# Patient Record
Sex: Male | Born: 1990 | Race: White | Hispanic: No | Smoking: Never smoker
Health system: Southern US, Community
[De-identification: ages and names within clinical notes are randomized; demographics above are authoritative.]

## PROBLEM LIST (undated history)

## (undated) DIAGNOSIS — J45909 Unspecified asthma, uncomplicated: Secondary | ICD-10-CM

## (undated) DIAGNOSIS — F419 Anxiety disorder, unspecified: Secondary | ICD-10-CM

## (undated) DIAGNOSIS — J189 Pneumonia, unspecified organism: Secondary | ICD-10-CM

## (undated) DIAGNOSIS — H5440 Blindness, one eye, unspecified eye: Secondary | ICD-10-CM

## (undated) HISTORY — PX: TONSILLECTOMY: SUR1361

---

## 2021-10-11 ENCOUNTER — Other Ambulatory Visit: Payer: Self-pay

## 2021-10-11 ENCOUNTER — Emergency Department (HOSPITAL_BASED_OUTPATIENT_CLINIC_OR_DEPARTMENT_OTHER)
Admission: EM | Admit: 2021-10-11 | Discharge: 2021-10-11 | Disposition: A | Discharge status: Home / Self Care | Payer: BC Managed Care – PPO | Attending: Emergency Medicine | Admitting: Emergency Medicine

## 2021-10-11 ENCOUNTER — Encounter (HOSPITAL_BASED_OUTPATIENT_CLINIC_OR_DEPARTMENT_OTHER): Payer: Self-pay | Admitting: *Deleted

## 2021-10-11 ENCOUNTER — Emergency Department (HOSPITAL_BASED_OUTPATIENT_CLINIC_OR_DEPARTMENT_OTHER): Payer: BC Managed Care – PPO | Admitting: Radiology

## 2021-10-11 DIAGNOSIS — R55 Syncope and collapse: Secondary | ICD-10-CM | POA: Diagnosis not present

## 2021-10-11 DIAGNOSIS — J45909 Unspecified asthma, uncomplicated: Secondary | ICD-10-CM | POA: Insufficient documentation

## 2021-10-11 HISTORY — DX: Unspecified asthma, uncomplicated: J45.909

## 2021-10-11 HISTORY — DX: Pneumonia, unspecified organism: J18.9

## 2021-10-11 HISTORY — DX: Anxiety disorder, unspecified: F41.9

## 2021-10-11 HISTORY — DX: Blindness, one eye, unspecified eye: H54.40

## 2021-10-11 NOTE — ED Triage Notes (Signed)
About an hour ago tightness in lower neck going to back, numbness in left body below the neck, blurred vision. States he is having trouble thinking and completing at sentences. No speech deficit in triage or difficulty answering questions.

## 2021-10-11 NOTE — ED Notes (Signed)
Pt d/c home per MD order. Discharge summary reviewed, pt verbalizes understanding. Ambulatory off unit with visitor. No s/s of acute distress noted at discharge.

## 2021-10-11 NOTE — Discharge Instructions (Signed)
Please return for worsening or persistent chest discomfort or if you get symptoms that do not improve with lying back flat.  Please let your doctor that prescribes your Adderall know that you are having some symptoms after taking it.  See if they want to change your dose or maybe change medications.

## 2021-10-11 NOTE — ED Provider Notes (Signed)
[facility] EMERGENCY DEPT Provider Note   CSN: [id number] Arrival date & time: 10/11/21  1330     History Chief Complaint  Patient presents with   Visual Field Change    Frederick Collins is a 30 y.o. male.  30 yo M with a chief complaints of feeling like there is pressure on both the front and back of his thorax this lasted for about a second and then he felt like he was short of breath and lightheaded and felt like his vision got blurry and he felt numb and tingly mostly on his left side.  He became quite anxious and had called 911.  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  He is progressively gotten better since then.  He denies any ongoing numbness or weakness.  Denies headaches or neck pain.  Denies cough congestion or fever.  He does endorse recently starting Adderall and has had some symptoms of feel like his heart is racing and some episodes of panic usually after having caffeine with it.  The history is provided by the patient and a friend.  Illness Severity:  Moderate Onset quality:  Gradual Duration:  2 minutes Timing:  Constant Progression:  Worsening Chronicity:  New Associated symptoms: chest pain (lasted for about a second, felt like someone pushed on the front and back)   Associated symptoms: no abdominal pain, no congestion, no diarrhea, no fever, no headaches, no myalgias, no rash, no shortness of breath and no vomiting       Past Medical History:  Diagnosis Date   Anxiety    Asthma    Blind right eye    Pneumonia     There are no problems to display for this patient.   Past Surgical History:  Procedure Laterality Date   TONSILLECTOMY         No family history on file.  Social History   Tobacco Use   Smoking status: Never   Smokeless tobacco: Never  Vaping Use   Vaping Use: Never used  Substance Use Topics   Alcohol use: Yes    Comment: rarely   Drug use: Not Currently    Home  Medications Prior to Admission medications   Not on File    Allergies    Vancomycin  Review of Systems   Review of Systems  Constitutional:  Negative for chills and fever.  HENT:  Negative for congestion and facial swelling.   Eyes:  Negative for discharge and visual disturbance.  Respiratory:  Negative for shortness of breath.   Cardiovascular:  Positive for chest pain (lasted for about a second, felt like someone pushed on the front and back). Negative for palpitations.  Gastrointestinal:  Negative for abdominal pain, diarrhea and vomiting.  Musculoskeletal:  Negative for arthralgias and myalgias.  Skin:  Negative for color change and rash.  Neurological:  Positive for syncope (near) and numbness (tingling mostly to left side of his body). Negative for tremors and headaches.  Psychiatric/Behavioral:  Negative for confusion and dysphoric mood.    Physical Exam Updated Vital Signs BP (!) 141/84   Pulse 90   Temp 98.4 F (36.9 C) (Oral)   Resp 18   Ht 5\' 8"  (1.727 m)   Wt 124.7 kg   SpO2 99%   BMI 41.81 kg/m   Physical Exam Vitals and nursing note reviewed.  Constitutional:      Appearance: He is well-developed.  HENT:     Head:  Normocephalic and atraumatic.  Eyes:     Pupils: Pupils are equal, round, and reactive to light.  Neck:     Vascular: No JVD.  Cardiovascular:     Rate and Rhythm: Normal rate and regular rhythm.     Heart sounds: No murmur heard.   No friction rub. No gallop.  Pulmonary:     Effort: No respiratory distress.     Breath sounds: No wheezing.  Abdominal:     General: There is no distension.     Tenderness: There is no abdominal tenderness. There is no guarding or rebound.  Musculoskeletal:        General: Normal range of motion.     Cervical back: Normal range of motion and neck supple.  Skin:    Coloration: Skin is not pale.     Findings: No rash.  Neurological:     Mental Status: He is alert and oriented to person, place, and time.      GCS: GCS eye subscore is 4. GCS verbal subscore is 5. GCS motor subscore is 6.     Cranial Nerves: Cranial nerves 2-12 are intact.     Sensory: Sensation is intact.     Motor: Motor function is intact.     Coordination: Coordination is intact.     Gait: Gait is intact.  Psychiatric:        Behavior: Behavior normal.    ED Results / Procedures / Treatments   Labs (all labs ordered are listed, but only abnormal results are displayed) Labs Reviewed - No data to display  EKG EKG Interpretation  Date/Time:  Friday October 11 2021 13:39:17 EST Ventricular Rate:  98 PR Interval:  154 QRS Duration: 94 QT Interval:  360 QTC Calculation: 459 R Axis:   63 Text Interpretation: Normal sinus rhythm Normal ECG no wpw, prolonged qt or brugada Otherwise no significant change Confirmed by Deno Etienne [phone]) on 10/11/2021 2:03:40 PM  Radiology No results found.  Procedures Procedures   Medications Ordered in ED Medications - No data to display  ED Course  I have reviewed the triage vital signs and the nursing notes.  Pertinent labs & imaging results that were available during my care of the patient were reviewed by me and considered in my medical decision making (see chart for details).    MDM Rules/Calculators/A&P                           30 yo M with a chief complaints of sensation that he had pressure on his chest and his back simultaneously it lasted for about a second it was followed by a period of feeling like his vision was blurry he had some tingling diffusely but worse on the left than the right and felt a bit fatigued.  This improved rapidly after he laid down flat.  I suspect he had a vasovagal event.  He is now very close if not at his baseline.  He had an EKG that does not show Wolff-Parkinson-White or prolonged QT or Brugada.  His chest pain sounds very atypical and I feel no further work-up is required for it.  We will have him follow-up with his family doctor.  2:30  PM:  I have discussed the diagnosis/risks/treatment options with the patient and family and believe the pt to be eligible for discharge home to follow-up with PCP. We also discussed returning to the ED immediately if new or worsening sx occur.  We discussed the sx which are most concerning (e.g., sudden worsening pain, fever, inability to tolerate by mouth) that necessitate immediate return. Medications administered to the patient during their visit and any new prescriptions provided to the patient are listed below.  Medications given during this visit Medications - No data to display   The patient appears reasonably screen and/or stabilized for discharge and I doubt any other medical condition or other [facility] requiring further screening, evaluation, or treatment in the ED at this time prior to discharge.   Final Clinical Impression(s) / ED Diagnoses Final diagnoses:  Vasovagal episode    Rx / DC Orders ED Discharge Orders     None        Deno Etienne, DO 10/11/21 1430

## 2021-10-24 DIAGNOSIS — F410 Panic disorder [episodic paroxysmal anxiety] without agoraphobia: Secondary | ICD-10-CM | POA: Diagnosis not present

## 2021-10-24 DIAGNOSIS — F909 Attention-deficit hyperactivity disorder, unspecified type: Secondary | ICD-10-CM | POA: Diagnosis not present

## 2021-10-26 DIAGNOSIS — H66003 Acute suppurative otitis media without spontaneous rupture of ear drum, bilateral: Secondary | ICD-10-CM | POA: Diagnosis not present

## 2021-10-26 DIAGNOSIS — H6123 Impacted cerumen, bilateral: Secondary | ICD-10-CM | POA: Diagnosis not present

## 2021-11-08 DIAGNOSIS — M722 Plantar fascial fibromatosis: Secondary | ICD-10-CM | POA: Diagnosis not present

## 2021-11-08 DIAGNOSIS — F329 Major depressive disorder, single episode, unspecified: Secondary | ICD-10-CM | POA: Diagnosis not present

## 2021-11-08 DIAGNOSIS — R220 Localized swelling, mass and lump, head: Secondary | ICD-10-CM | POA: Diagnosis not present

## 2021-11-08 DIAGNOSIS — F411 Generalized anxiety disorder: Secondary | ICD-10-CM | POA: Diagnosis not present

## 2021-11-15 DIAGNOSIS — R220 Localized swelling, mass and lump, head: Secondary | ICD-10-CM | POA: Diagnosis not present

## 2021-12-09 ENCOUNTER — Other Ambulatory Visit: Payer: Self-pay

## 2021-12-09 ENCOUNTER — Ambulatory Visit: Payer: BC Managed Care – PPO | Admitting: Plastic Surgery

## 2021-12-09 VITALS — BP 145/89 | HR 89 | Ht 67.0 in | Wt 291.6 lb

## 2021-12-09 DIAGNOSIS — D179 Benign lipomatous neoplasm, unspecified: Secondary | ICD-10-CM | POA: Diagnosis not present

## 2021-12-10 ENCOUNTER — Encounter: Payer: Self-pay | Admitting: Plastic Surgery

## 2021-12-10 NOTE — Progress Notes (Signed)
° °  Referring Provider Collene Leyden, MD [address] [place],  [place] [address]   CC:  Left forehead lipoma  Frederick Collins is an 31 y.o. male.  HPI: Patient is a 31 year old male who has a 2 3 history of a left forehead mass.  He notes its grown by a small amount.  He had ultrasound at [facility] approximately a month ago which he said was consistent with fatty mass.  He is interested in excision.  Allergies  Allergen Reactions   Vancomycin Rash    Outpatient Encounter Medications as of 12/09/2021  Medication Sig   buPROPion (WELLBUTRIN XL) 150 MG 24 hr tablet Take 150 mg by mouth daily.   escitalopram (LEXAPRO) 20 MG tablet Take 20 mg by mouth daily.   No facility-administered encounter medications on file as of 12/09/2021.     Past Medical History:  Diagnosis Date   Anxiety    Asthma    Blind right eye    Pneumonia     Past Surgical History:  Procedure Laterality Date   TONSILLECTOMY      No family history on file.  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  We discussed options and the patient would like to proceed with excision under anesthesia.  Lennice Sites 12/10/2021, 10:57 PM

## 2021-12-23 ENCOUNTER — Encounter: Payer: Self-pay | Admitting: Plastic Surgery

## 2021-12-23 ENCOUNTER — Telehealth (INDEPENDENT_AMBULATORY_CARE_PROVIDER_SITE_OTHER): Payer: BC Managed Care – PPO | Admitting: Plastic Surgery

## 2021-12-23 DIAGNOSIS — D179 Benign lipomatous neoplasm, unspecified: Secondary | ICD-10-CM

## 2021-12-23 MED ORDER — ONDANSETRON 4 MG PO TBDP: 4.0000 mg | ORAL_TABLET | Freq: Three times a day (TID) | ORAL | 0 refills | Status: AC | PRN

## 2021-12-23 MED ORDER — OXYCODONE HCL 5 MG PO TABS: 5.0000 mg | ORAL_TABLET | ORAL | 0 refills | Status: AC | PRN

## 2021-12-23 NOTE — Progress Notes (Signed)
° °    Patient ID: Frederick Collins, male    DOB: 1991-07-02, 31 y.o.   MRN: [id number]  No chief complaint on file.     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  male  with a history of left forehead fatty mass.  He presents for preoperative evaluation for upcoming procedure, excision of lipoma left forehead, scheduled for 12/31/21 with Dr. Erin Hearing.  The patient has not had problems with anesthesia.     PMH Significant for: Tonsillectomy   Past Medical History: Allergies: Allergies  Allergen Reactions   Vancomycin Rash    Current Medications:  Current Outpatient Medications:    buPROPion (WELLBUTRIN XL) 150 MG 24 hr tablet, Take 150 mg by mouth daily., Disp: , Rfl:    escitalopram (LEXAPRO) 20 MG tablet, Take 20 mg by mouth daily., Disp: , Rfl:   Past Medical Problems: Past Medical History:  Diagnosis Date   Anxiety    Asthma    Blind right eye    Pneumonia     Past Surgical History: Past Surgical History:  Procedure Laterality Date   TONSILLECTOMY      Social History: Social History   Socioeconomic History   Marital status: Significant Other    Spouse name: Not on file   Number of children: Not on file   Years of education: Not on file   Highest education level: Not on file  Occupational History   Not on file  Tobacco Use   Smoking status: Never   Smokeless tobacco: Never  Vaping Use   Vaping Use: Never used  Substance and Sexual Activity   Alcohol use: Yes    Comment: rarely   Drug use: Not Currently   Sexual activity: Not on file  Other Topics Concern   Not on file  Social History Narrative   Not on file   Social Determinants of Health   Financial Resource Strain: Not on file  Food Insecurity: Not on file  Transportation Needs: Not on file  Physical Activity: Not on file  Stress: Not on file  Social Connections: Not on file  Intimate Partner Violence: Not on file    Family  History: No family history on file.  Review of Systems: ROS  Physical Exam: Vital Signs There were no vitals taken for this visit.  Physical Exam  Virtual exam.  Left forehead mass unchanged from previously, 3.5 x 3 cm.  Assessment/Plan: The patient is scheduled for excision left forehead mass with Dr.  Erin Hearing .  Risks, benefits, and alternatives of procedure discussed, questions answered and consent obtained.    Smoking Status: None   Caprini Score: 2; Recommendation for mechanical  prophylaxis. Encourage Frederick ambulation.     Post-op Rx sent to pharmacy: [facility], oxycodone, zofran  Patient was provided with the General Surgical Risk consent document and Pain Medication Agreement prior to their appointment.  They had adequate time to read through the risk consent documents and Pain Medication Agreement. We also discussed them in person together during this preop appointment. All of their questions were answered to their satisfaction.  Recommended calling if they have any further questions.  Risk consent form and Pain Medication Agreement to be scanned into patient's chart.     Electronically signed by: Lennice Sites, MD 12/23/2021 1:46 PM

## 2021-12-26 NOTE — Pre-Procedure Instructions (Signed)
Surgical Instructions    Your procedure is scheduled on Tuesday, February 28th.  Report to [facility] Main Entrance "A" at 10:15 A.M., then check in with the Admitting office.  Call this number if you have problems the morning of surgery:  [phone]   If you have any questions prior to your surgery date call [phone]: Open Monday-Friday 8am-4pm    Remember:  Do not eat after midnight the night before your surgery  You may drink clear liquids until 09:15 AM the morning of your surgery.   Clear liquids allowed are: Water, Non-Citrus Juices (without pulp), Carbonated Beverages, Clear Tea, Black Coffee Only (NO MILK, CREAM OR POWDERED CREAMER of any kind), and Gatorade.    Take these medicines the morning of surgery with A SIP OF WATER  buPROPion (WELLBUTRIN XL)  escitalopram (LEXAPRO)  ondansetron (ZOFRAN-ODT)- if needed oxyCODONE (ROXICODONE)- if needed  As of today, STOP taking any Aspirin (unless otherwise instructed by your surgeon) Aleve, Naproxen, Ibuprofen, Motrin, Advil, Goody's, BC's, all herbal medications, fish oil, and all vitamins.                     Do NOT Smoke (Tobacco/Vaping) for 24 hours prior to your procedure.  If you use a CPAP at night, you may bring your mask/headgear for your overnight stay.   Contacts, glasses, piercing's, hearing aid's, dentures or partials may not be worn into surgery, please bring cases for these belongings.    For patients admitted to the hospital, discharge time will be determined by your treatment team.   Patients discharged the day of surgery will not be allowed to drive home, and someone needs to stay with them for 24 hours.  NO VISITORS WILL BE ALLOWED IN PRE-OP WHERE PATIENTS ARE PREPPED FOR SURGERY.  ONLY 1 SUPPORT PERSON MAY BE PRESENT IN THE WAITING ROOM WHILE YOU ARE IN SURGERY.  IF YOU ARE TO BE ADMITTED, ONCE YOU ARE IN YOUR ROOM YOU WILL BE ALLOWED TWO (2) VISITORS. (1) VISITOR MAY STAY OVERNIGHT BUT MUST ARRIVE TO  THE ROOM BY 8pm.  Minor children may have two parents present. Special consideration for safety and communication needs will be reviewed on a case by case basis.   Special instructions:   [facility]- Preparing For Surgery  Before surgery, you can play an important role. Because skin is not sterile, your skin needs to be as free of germs as possible. You can reduce the number of germs on your skin by washing with CHG (chlorahexidine gluconate) Soap before surgery.  CHG is an antiseptic cleaner which kills germs and bonds with the skin to continue killing germs even after washing.    Oral Hygiene is also important to reduce your risk of infection.  Remember - BRUSH YOUR TEETH THE MORNING OF SURGERY WITH YOUR REGULAR TOOTHPASTE  Please do not use if you have an allergy to CHG or antibacterial soaps. If your skin becomes reddened/irritated stop using the CHG.  Do not shave (including legs and underarms) for at least 48 hours prior to first CHG shower. It is OK to shave your face.  Please follow these instructions carefully.   Shower the NIGHT BEFORE SURGERY and the MORNING OF SURGERY  If you chose to wash your hair, wash your hair first as usual with your normal shampoo.  After you shampoo, rinse your hair and body thoroughly to remove the shampoo.  Use CHG Soap as you would any other liquid soap. You can apply CHG  directly to the skin and wash gently with a scrungie or a clean washcloth.   Apply the CHG Soap to your body ONLY FROM THE NECK DOWN.  Do not use on open wounds or open sores. Avoid contact with your eyes, ears, mouth and genitals (private parts). Wash Face and genitals (private parts)  with your normal soap.   Wash thoroughly, paying special attention to the area where your surgery will be performed.  Thoroughly rinse your body with warm water from the neck down.  DO NOT shower/wash with your normal soap after using and rinsing off the CHG Soap.  Pat yourself dry with a CLEAN  TOWEL.  Wear CLEAN PAJAMAS to bed the night before surgery  Place CLEAN SHEETS on your bed the night before your surgery  DO NOT SLEEP WITH PETS.   Day of Surgery: Shower with CHG soap. Do not wear jewelry Do not wear lotions, powders, colognes, or deodorant. Do not shave 48 hours prior to surgery.  Men may shave face and neck. Do not bring valuables to the hospital. [facility] is not responsible for any belongings or valuables. Wear Clean/Comfortable clothing the morning of surgery Remember to brush your teeth WITH YOUR REGULAR TOOTHPASTE.   Please read over the following fact sheets that you were given.   3 days prior to your procedure or After your COVID test   You are not required to quarantine however you are required to wear a well-fitting mask when you are out and around people not in your household. If your mask becomes wet or soiled, replace with a new one.   Wash your hands often with soap and water for 20 seconds or clean your hands with an alcohol-based hand sanitizer that contains at least 60% alcohol.   Do not share personal items.   Notify your provider:  o if you are in close contact with someone who has COVID  o or if you develop a fever of 100.4 or greater, sneezing, cough, sore throat, shortness of breath or body aches.

## 2021-12-27 ENCOUNTER — Inpatient Hospital Stay (HOSPITAL_COMMUNITY)
Admission: RE | Admit: 2021-12-27 | Discharge: 2021-12-27 | Disposition: A | Discharge status: Home / Self Care | Payer: BC Managed Care – PPO | Source: Ambulatory Visit

## 2021-12-31 ENCOUNTER — Ambulatory Visit (HOSPITAL_COMMUNITY): Admission: RE | Admit: 2021-12-31 | Payer: BC Managed Care – PPO | Source: Home / Self Care | Admitting: Plastic Surgery

## 2021-12-31 SURGERY — EXCISION LIPOMA
Anesthesia: General | Site: Face | Laterality: Left

## 2022-01-06 ENCOUNTER — Encounter: Payer: BC Managed Care – PPO | Admitting: Plastic Surgery

## 2022-01-24 DIAGNOSIS — Z13220 Encounter for screening for lipoid disorders: Secondary | ICD-10-CM | POA: Diagnosis not present

## 2022-01-24 DIAGNOSIS — Z23 Encounter for immunization: Secondary | ICD-10-CM | POA: Diagnosis not present

## 2022-01-24 DIAGNOSIS — Z1159 Encounter for screening for other viral diseases: Secondary | ICD-10-CM | POA: Diagnosis not present

## 2022-01-24 DIAGNOSIS — Z131 Encounter for screening for diabetes mellitus: Secondary | ICD-10-CM | POA: Diagnosis not present

## 2022-01-24 DIAGNOSIS — Z0000 Encounter for general adult medical examination without abnormal findings: Secondary | ICD-10-CM | POA: Diagnosis not present

## 2022-04-11 DIAGNOSIS — E785 Hyperlipidemia, unspecified: Secondary | ICD-10-CM | POA: Diagnosis not present

## 2022-04-11 DIAGNOSIS — R7309 Other abnormal glucose: Secondary | ICD-10-CM | POA: Diagnosis not present

## 2022-04-11 DIAGNOSIS — F410 Panic disorder [episodic paroxysmal anxiety] without agoraphobia: Secondary | ICD-10-CM | POA: Diagnosis not present

## 2022-08-01 DIAGNOSIS — R7309 Other abnormal glucose: Secondary | ICD-10-CM | POA: Diagnosis not present

## 2022-08-01 DIAGNOSIS — F411 Generalized anxiety disorder: Secondary | ICD-10-CM | POA: Diagnosis not present

## 2022-08-01 DIAGNOSIS — R220 Localized swelling, mass and lump, head: Secondary | ICD-10-CM | POA: Diagnosis not present

## 2022-08-01 DIAGNOSIS — F329 Major depressive disorder, single episode, unspecified: Secondary | ICD-10-CM | POA: Diagnosis not present

## 2022-08-01 DIAGNOSIS — E785 Hyperlipidemia, unspecified: Secondary | ICD-10-CM | POA: Diagnosis not present

## 2022-10-24 DIAGNOSIS — R059 Cough, unspecified: Secondary | ICD-10-CM | POA: Diagnosis not present

## 2022-11-09 DIAGNOSIS — H6122 Impacted cerumen, left ear: Secondary | ICD-10-CM | POA: Diagnosis not present

## 2022-11-09 DIAGNOSIS — Z6841 Body Mass Index (BMI) 40.0 and over, adult: Secondary | ICD-10-CM | POA: Diagnosis not present

## 2022-11-09 DIAGNOSIS — Z23 Encounter for immunization: Secondary | ICD-10-CM | POA: Diagnosis not present

## 2023-01-27 DIAGNOSIS — E785 Hyperlipidemia, unspecified: Secondary | ICD-10-CM | POA: Diagnosis not present

## 2023-01-27 DIAGNOSIS — R7309 Other abnormal glucose: Secondary | ICD-10-CM | POA: Diagnosis not present

## 2023-01-27 DIAGNOSIS — K219 Gastro-esophageal reflux disease without esophagitis: Secondary | ICD-10-CM | POA: Diagnosis not present

## 2023-01-27 DIAGNOSIS — Z0000 Encounter for general adult medical examination without abnormal findings: Secondary | ICD-10-CM | POA: Diagnosis not present

## 2023-03-06 DIAGNOSIS — E785 Hyperlipidemia, unspecified: Secondary | ICD-10-CM | POA: Diagnosis not present

## 2023-06-02 DIAGNOSIS — F329 Major depressive disorder, single episode, unspecified: Secondary | ICD-10-CM | POA: Diagnosis not present

## 2023-06-02 DIAGNOSIS — F411 Generalized anxiety disorder: Secondary | ICD-10-CM | POA: Diagnosis not present

## 2023-07-04 DIAGNOSIS — F331 Major depressive disorder, recurrent, moderate: Secondary | ICD-10-CM | POA: Diagnosis not present

## 2023-07-15 DIAGNOSIS — D170 Benign lipomatous neoplasm of skin and subcutaneous tissue of head, face and neck: Secondary | ICD-10-CM | POA: Diagnosis not present

## 2023-07-15 DIAGNOSIS — D485 Neoplasm of uncertain behavior of skin: Secondary | ICD-10-CM | POA: Diagnosis not present

## 2023-07-31 ENCOUNTER — Other Ambulatory Visit: Payer: Self-pay

## 2023-07-31 MED ORDER — COVID-19 MRNA VAC-TRIS(PFIZER) 30 MCG/0.3ML IM SUSY
0.3000 mL | PREFILLED_SYRINGE | Freq: Once | INTRAMUSCULAR | 0 refills | Status: AC
  Filled 2023-07-31: qty 0.3, 1d supply, fill #0

## 2023-08-02 DIAGNOSIS — H60319 Diffuse otitis externa, unspecified ear: Secondary | ICD-10-CM | POA: Diagnosis not present

## 2023-08-03 DIAGNOSIS — F331 Major depressive disorder, recurrent, moderate: Secondary | ICD-10-CM | POA: Diagnosis not present

## 2023-08-04 DIAGNOSIS — H6123 Impacted cerumen, bilateral: Secondary | ICD-10-CM | POA: Diagnosis not present

## 2023-08-04 DIAGNOSIS — H60501 Unspecified acute noninfective otitis externa, right ear: Secondary | ICD-10-CM | POA: Diagnosis not present

## 2023-08-11 DIAGNOSIS — L538 Other specified erythematous conditions: Secondary | ICD-10-CM | POA: Diagnosis not present

## 2023-08-11 DIAGNOSIS — R208 Other disturbances of skin sensation: Secondary | ICD-10-CM | POA: Diagnosis not present

## 2023-08-11 DIAGNOSIS — L918 Other hypertrophic disorders of the skin: Secondary | ICD-10-CM | POA: Diagnosis not present

## 2023-08-20 DIAGNOSIS — F411 Generalized anxiety disorder: Secondary | ICD-10-CM | POA: Diagnosis not present

## 2023-08-20 DIAGNOSIS — F329 Major depressive disorder, single episode, unspecified: Secondary | ICD-10-CM | POA: Diagnosis not present

## 2023-08-20 DIAGNOSIS — E785 Hyperlipidemia, unspecified: Secondary | ICD-10-CM | POA: Diagnosis not present

## 2023-08-20 DIAGNOSIS — F909 Attention-deficit hyperactivity disorder, unspecified type: Secondary | ICD-10-CM | POA: Diagnosis not present

## 2023-09-03 DIAGNOSIS — F331 Major depressive disorder, recurrent, moderate: Secondary | ICD-10-CM | POA: Diagnosis not present

## 2023-10-03 DIAGNOSIS — F331 Major depressive disorder, recurrent, moderate: Secondary | ICD-10-CM | POA: Diagnosis not present

## 2023-10-18 IMAGING — DX DG CHEST 2V
2 series · 2 of 2 positions shown · non-contrast
Comparison: None.

CLINICAL DATA: Chest pain

EXAM:
CHEST - 2 VIEW

[chest pa]
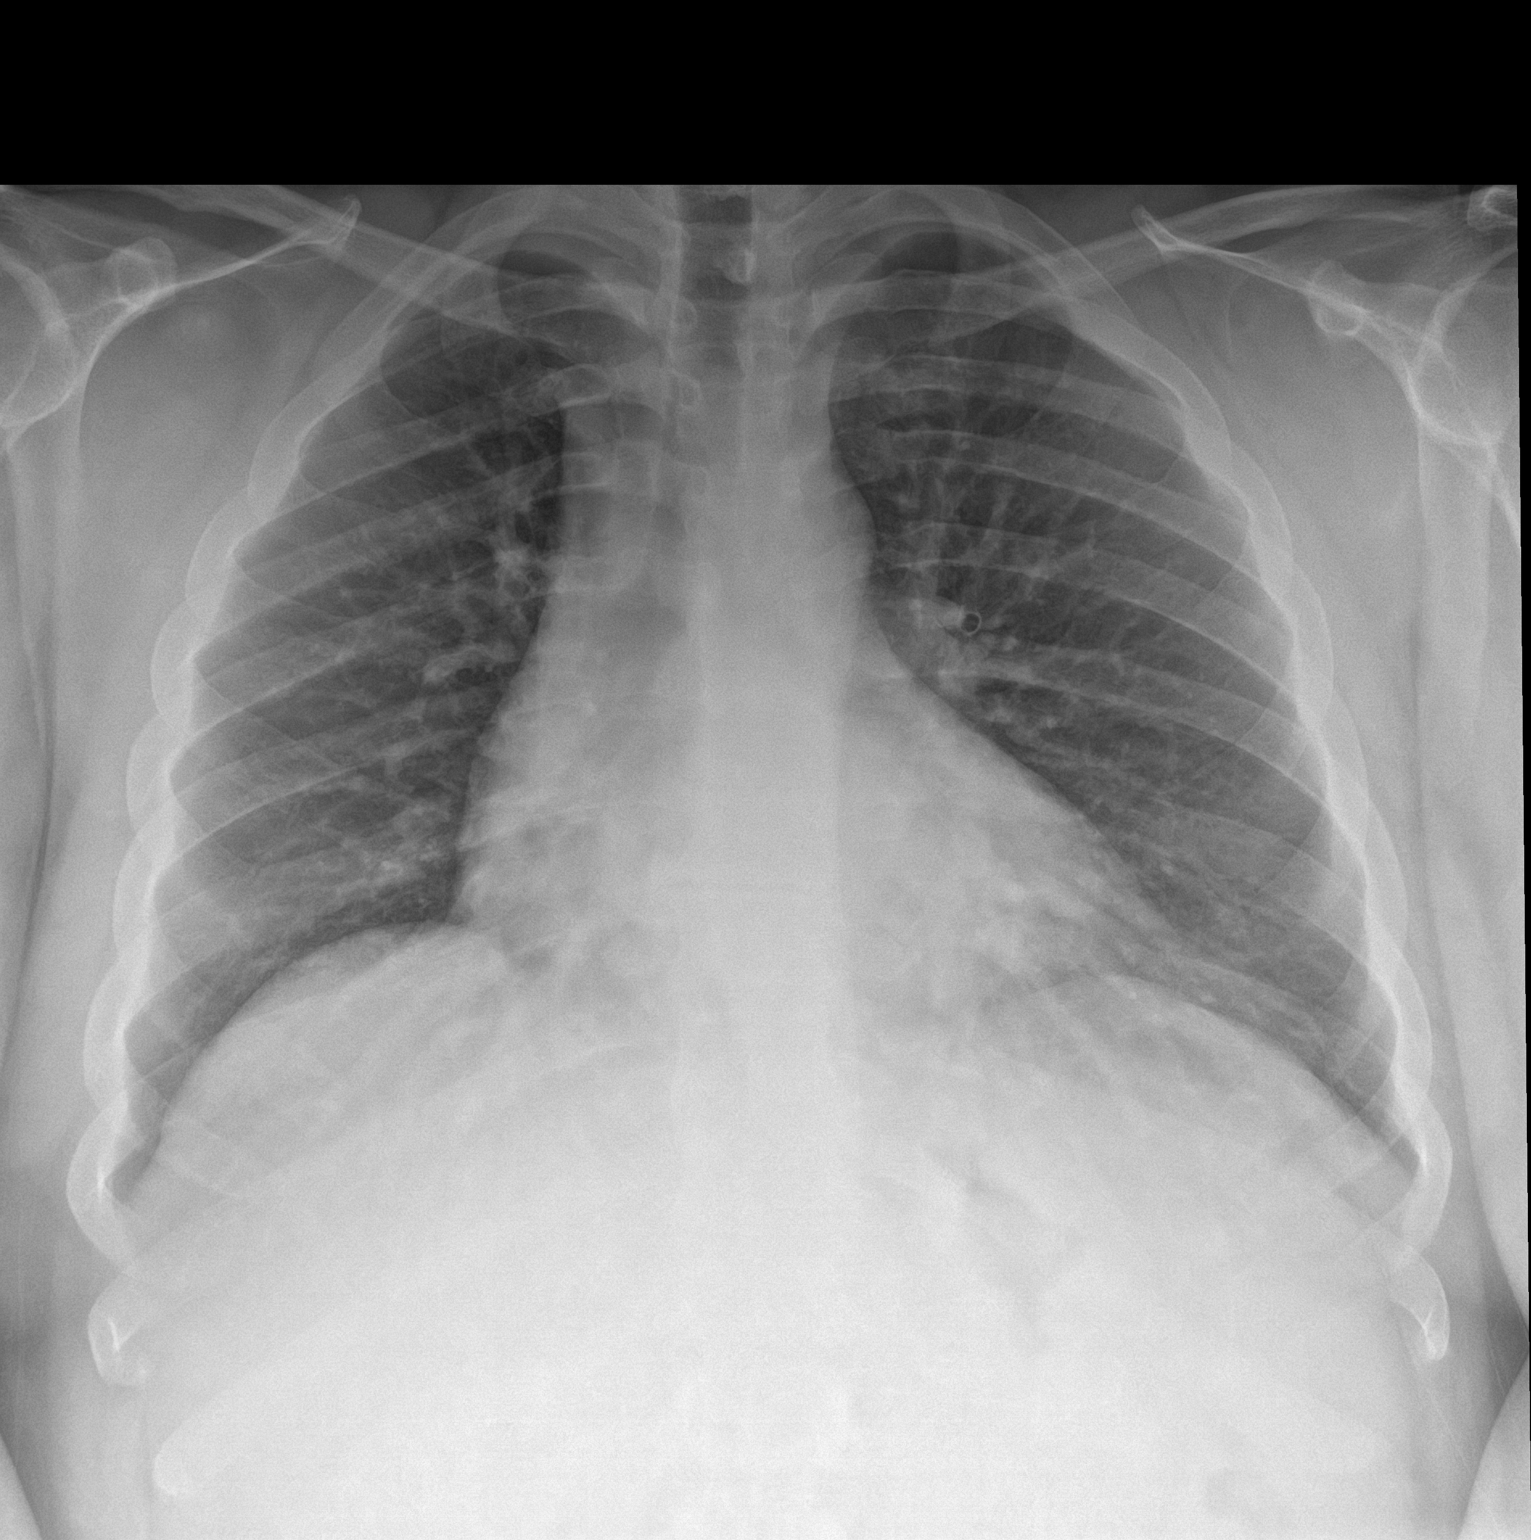

[chest lat]
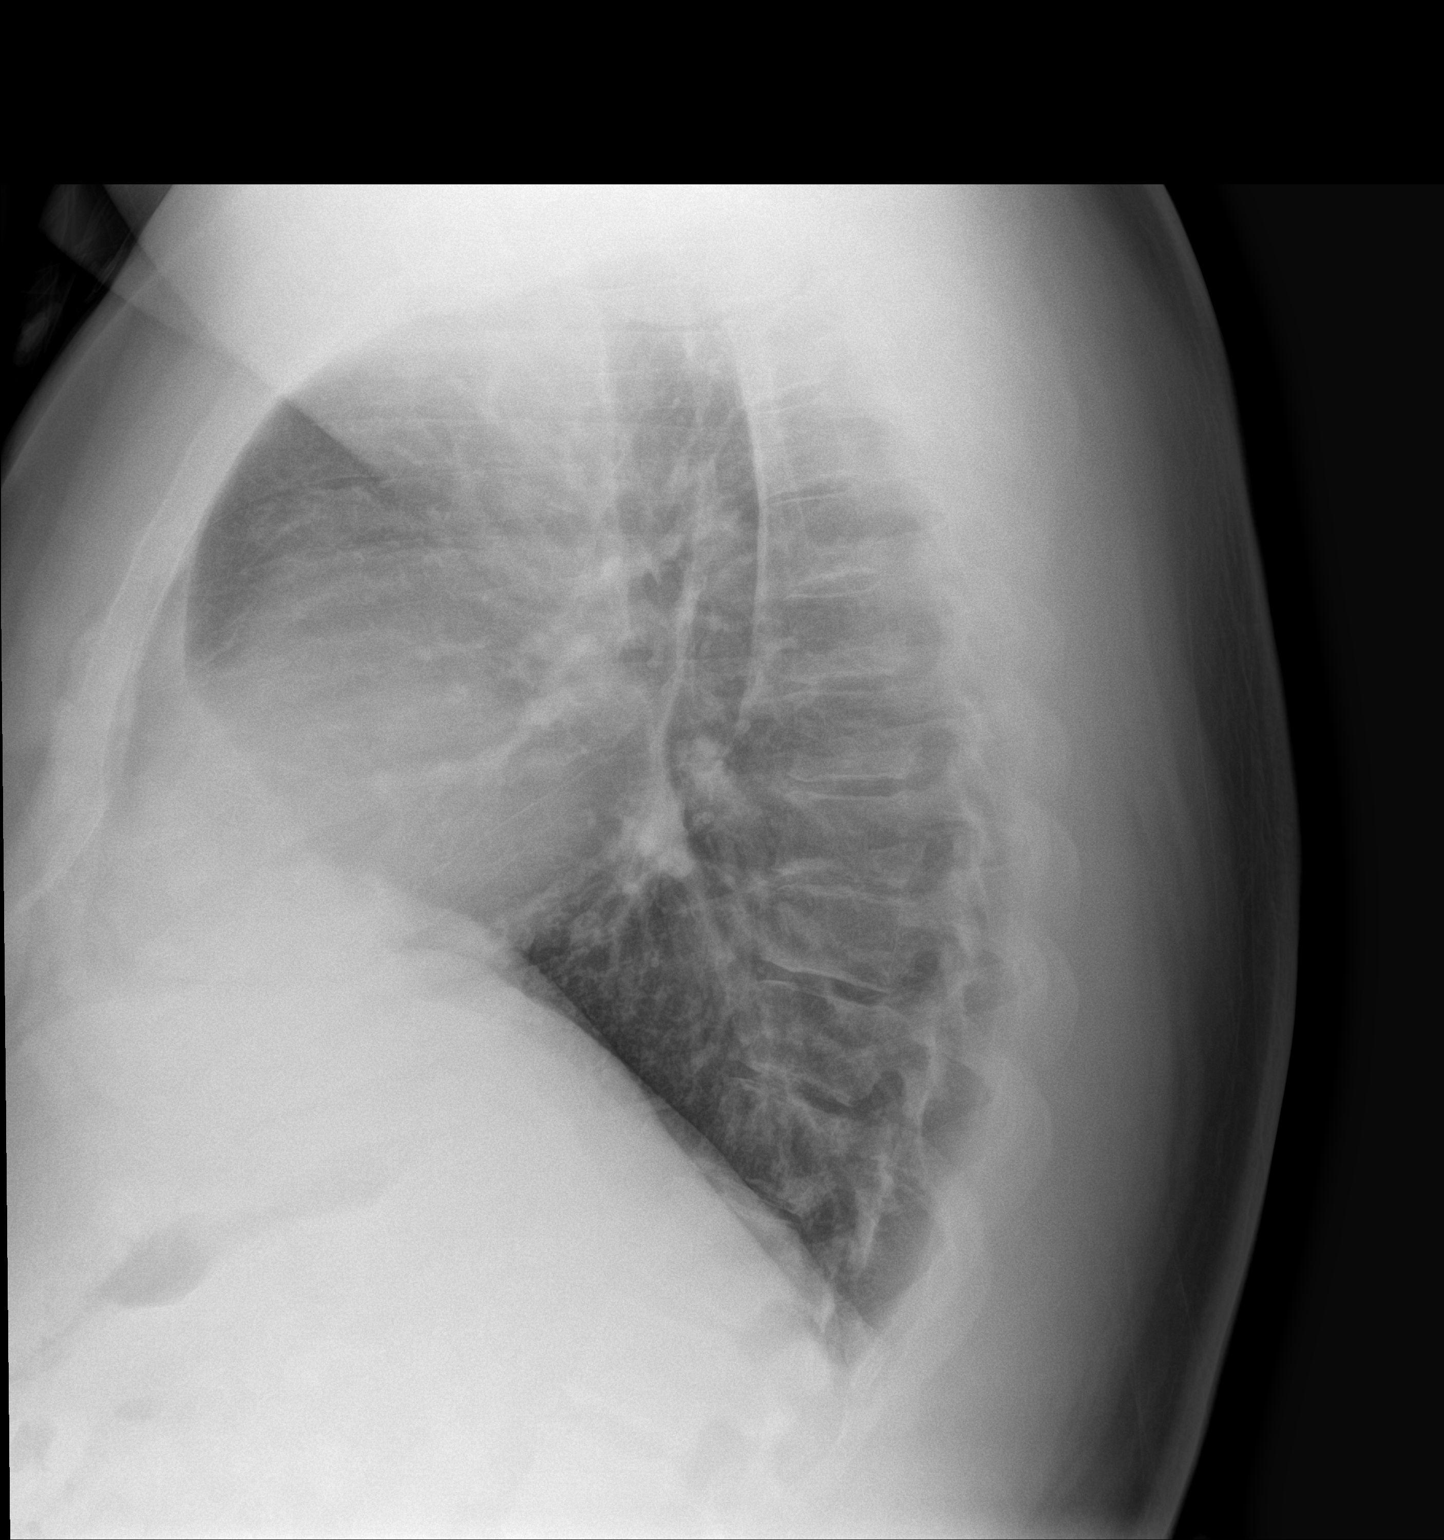

[2 of 2 positions shown; findings below may reference images not displayed]

FINDINGS: The heart size and mediastinal contours are within normal limits.
Both lungs are clear. The visualized skeletal structures are
unremarkable.
IMPRESSION: No active cardiopulmonary disease.

## 2023-11-03 DIAGNOSIS — F331 Major depressive disorder, recurrent, moderate: Secondary | ICD-10-CM | POA: Diagnosis not present

## 2024-02-23 DIAGNOSIS — E785 Hyperlipidemia, unspecified: Secondary | ICD-10-CM | POA: Diagnosis not present

## 2024-02-25 DIAGNOSIS — F411 Generalized anxiety disorder: Secondary | ICD-10-CM | POA: Diagnosis not present

## 2024-02-25 DIAGNOSIS — R350 Frequency of micturition: Secondary | ICD-10-CM | POA: Diagnosis not present

## 2024-02-25 DIAGNOSIS — Z0000 Encounter for general adult medical examination without abnormal findings: Secondary | ICD-10-CM | POA: Diagnosis not present

## 2024-02-25 DIAGNOSIS — E785 Hyperlipidemia, unspecified: Secondary | ICD-10-CM | POA: Diagnosis not present

## 2024-02-25 DIAGNOSIS — R7303 Prediabetes: Secondary | ICD-10-CM | POA: Diagnosis not present

## 2024-05-27 DIAGNOSIS — R7303 Prediabetes: Secondary | ICD-10-CM | POA: Diagnosis not present

## 2024-06-03 DIAGNOSIS — R7303 Prediabetes: Secondary | ICD-10-CM | POA: Diagnosis not present

## 2024-06-03 DIAGNOSIS — F411 Generalized anxiety disorder: Secondary | ICD-10-CM | POA: Diagnosis not present

## 2024-06-03 DIAGNOSIS — G4733 Obstructive sleep apnea (adult) (pediatric): Secondary | ICD-10-CM | POA: Diagnosis not present

## 2024-06-16 DIAGNOSIS — G4733 Obstructive sleep apnea (adult) (pediatric): Secondary | ICD-10-CM | POA: Diagnosis not present

## 2024-10-07 DIAGNOSIS — E785 Hyperlipidemia, unspecified: Secondary | ICD-10-CM | POA: Diagnosis not present

## 2024-10-07 DIAGNOSIS — E119 Type 2 diabetes mellitus without complications: Secondary | ICD-10-CM | POA: Diagnosis not present

## 2024-10-11 DIAGNOSIS — F329 Major depressive disorder, single episode, unspecified: Secondary | ICD-10-CM | POA: Diagnosis not present

## 2024-10-11 DIAGNOSIS — K219 Gastro-esophageal reflux disease without esophagitis: Secondary | ICD-10-CM | POA: Diagnosis not present

## 2024-10-11 DIAGNOSIS — F909 Attention-deficit hyperactivity disorder, unspecified type: Secondary | ICD-10-CM | POA: Diagnosis not present

## 2024-10-11 DIAGNOSIS — F411 Generalized anxiety disorder: Secondary | ICD-10-CM | POA: Diagnosis not present
